# Patient Record
Sex: Female | Born: 1995 | Race: Black or African American | Hispanic: No | Marital: Single | Smoking: Never smoker
Health system: Southern US, Community
[De-identification: ages and names within clinical notes are randomized; demographics above are authoritative.]

## PROBLEM LIST (undated history)

## (undated) DIAGNOSIS — D6800 Von Willebrand disease, unspecified: Secondary | ICD-10-CM

---
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
## 2015-07-12 ENCOUNTER — Emergency Department (HOSPITAL_COMMUNITY)
Admission: EM | Admit: 2015-07-12 | Discharge: 2015-07-12 | Disposition: A | Discharge status: Home / Self Care | Payer: Medicaid Other | Attending: Emergency Medicine | Admitting: Emergency Medicine

## 2015-07-12 ENCOUNTER — Encounter (HOSPITAL_COMMUNITY): Payer: Self-pay | Admitting: Emergency Medicine

## 2015-07-12 ENCOUNTER — Emergency Department (HOSPITAL_COMMUNITY): Payer: Medicaid Other

## 2015-07-12 DIAGNOSIS — Z79899 Other long term (current) drug therapy: Secondary | ICD-10-CM | POA: Diagnosis not present

## 2015-07-12 DIAGNOSIS — Z3202 Encounter for pregnancy test, result negative: Secondary | ICD-10-CM | POA: Insufficient documentation

## 2015-07-12 DIAGNOSIS — Z862 Personal history of diseases of the blood and blood-forming organs and certain disorders involving the immune mechanism: Secondary | ICD-10-CM | POA: Insufficient documentation

## 2015-07-12 DIAGNOSIS — R222 Localized swelling, mass and lump, trunk: Secondary | ICD-10-CM | POA: Insufficient documentation

## 2015-07-12 LAB — POC URINE PREG, ED: Preg Test, Ur: NEGATIVE

## 2015-07-12 NOTE — ED Notes (Signed)
Patient transported to X-ray 

## 2015-07-12 NOTE — ED Provider Notes (Signed)
CSN: [id number]     Arrival date & time 07/12/15  0116 History   First MD Initiated Contact with Patient 07/12/15 0416     No chief complaint on file.    (Consider location/radiation/quality/duration/timing/severity/associated sxs/prior Treatment) HPI   Patient is a 20 year old female, history of von Willebrand's, followed at [facility] hematology, otherwise healthy, presents to the emergency department with chief complaint of nontender non-erythematous bump located on her anterior chest just to the left of her sternum. She states that she met the shower tonight and her boyfriend made a comment about the bump. She states that she has noticed it for several weeks. She denies pain, erythema.  It is firm she believes is part of the chest wall and not part of her left breast. She denies any injury or trauma to her chest. She has no history of bony abnormalities or bony growths.   She denies shortness of breath, chest pain.  Past Medical History  Diagnosis Date  . Von Willebrand disease ([place])    History reviewed. No pertinent past surgical history. History reviewed. No pertinent family history. Social History  Substance Use Topics  . Smoking status: Never Smoker   . Smokeless tobacco: None  . Alcohol Use: No   OB History    No data available     Review of Systems  All other systems reviewed and are negative.     Allergies  Review of patient's allergies indicates no known allergies.  Home Medications   Prior to Admission medications   Medication Sig Start Date End Date Taking? Authorizing Provider  PRESCRIPTION MEDICATION Take 1 tablet by mouth daily. Oral contraceptive    Historical Provider, MD   BP 128/68 mmHg  Pulse 60  Temp(Src) 97.7 F (36.5 C) (Oral)  Resp 16  Wt 55.481 kg  SpO2 98% Physical Exam  Constitutional: She is oriented to person, place, and time. She appears well-developed and well-nourished. No distress.  HENT:  Head: Normocephalic and atraumatic.  Nose:  Nose normal.  Mouth/Throat: Oropharynx is clear and moist. No oropharyngeal exudate.  Eyes: Conjunctivae and EOM are normal. Pupils are equal, round, and reactive to light. Right eye exhibits no discharge. Left eye exhibits no discharge. No scleral icterus.  Neck: Normal range of motion. No JVD present. No tracheal deviation present. No thyromegaly present.  Cardiovascular: Normal rate, regular rhythm, normal heart sounds and intact distal pulses.  Exam reveals no gallop and no friction rub.   No murmur heard. Pulmonary/Chest: Effort normal and breath sounds normal. No respiratory distress. She has no wheezes. She has no rales. She exhibits no tenderness. Left breast exhibits no inverted nipple, no mass, no nipple discharge, no skin change and no tenderness. Breasts are symmetrical.    Breath sounds clear to auscultation anteriorly and posteriorly without wheezes, rales or rhonchi.  Abdominal: Soft. Bowel sounds are normal. She exhibits no distension and no mass. There is no tenderness. There is no rebound and no guarding.  Musculoskeletal: Normal range of motion. She exhibits no edema or tenderness.  Lymphadenopathy:    She has no cervical adenopathy.    She has no axillary adenopathy.       Left axillary: No pectoral and no lateral adenopathy present. Neurological: She is alert and oriented to person, place, and time. She has normal reflexes. No cranial nerve deficit. She exhibits normal muscle tone. Coordination normal.  Skin: Skin is warm and dry. No rash noted. She is not diaphoretic. No erythema. No pallor.  Psychiatric: She  has a normal mood and affect. Her behavior is normal. Judgment and thought content normal.  Nursing note and vitals reviewed.   ED Course  Procedures (including critical care time) Labs Review Labs Reviewed  POC URINE PREG, ED    Imaging Review Dg Chest 2 View  07/12/2015  CLINICAL DATA:  Tender LEFT sternal lump. EXAM: CHEST  2 VIEW COMPARISON:  None.  FINDINGS: Cardiomediastinal silhouette is normal. The lungs are clear without pleural effusions or focal consolidations. Trachea projects midline and there is no pneumothorax. Soft tissue planes and included osseous structures are non-suspicious. Mild upper thoracic dextroscoliosis. IMPRESSION: Normal chest. Electronically Signed   By: Elon Alas M.D.   On: 07/12/2015 05:22   I have personally reviewed and evaluated these images and lab results as part of my medical decision-making.   EKG Interpretation None      MDM   Patient with asymmetry to anterior chest wall, she has noticed it for several weeks however her boyfriend made a comment about it tonight and she presented to the ER for evaluation.  Small asymmetry to the left sternal border is noted on exam, firm to palpation.  No concern for skin infection, cellulitis or abscess.  Area is nontender, no crepitus.  Lungs are clear to auscultation.  Chest x-ray was obtained which did not observe any osseous structure abnormality in the anterior chest, patient does have mild upper thoracic dextroscoliosis, she may have chronic chest wall changes due to this abnormality.  The patient wasn't encouraged to follow up with PCP.  No concern for emergent medical condition which necessitates further workup or treatment.  Patient discharged in good condition with VSS. Filed Vitals:   07/12/15 0121 07/12/15 0553 07/12/15 0624  BP: 136/84 124/88 128/68  Pulse: 67 60 60  Temp: 98.2 F (36.8 C) 97.5 F (36.4 C) 97.7 F (36.5 C)  TempSrc: Oral Oral Oral  Resp: _0 Weight: 55.481 kg    SpO2: 99% 100% 98%     Final diagnoses:  Nodule of chest wall      Delsa Grana, PA-C 07/13/15 [place], MD 07/14/15 [id number]

## 2015-07-12 NOTE — ED Notes (Signed)
Patient arrives with complaint of noticing a bump on her chest. States that she exited the shower tonight and her boyfriend made a comment about it. Denies pain.

## 2015-07-12 NOTE — ED Notes (Signed)
Patient returned to room 6 from x-ray. 

## 2015-08-31 ENCOUNTER — Other Ambulatory Visit: Payer: Self-pay | Admitting: Pediatrics

## 2015-08-31 DIAGNOSIS — R634 Abnormal weight loss: Secondary | ICD-10-CM

## 2015-08-31 DIAGNOSIS — N63 Unspecified lump in unspecified breast: Secondary | ICD-10-CM

## 2015-09-03 ENCOUNTER — Ambulatory Visit
Admission: RE | Admit: 2015-09-03 | Discharge: 2015-09-03 | Disposition: A | Discharge status: Home / Self Care | Payer: Medicaid Other | Source: Ambulatory Visit | Attending: Pediatrics | Admitting: Pediatrics

## 2015-09-03 DIAGNOSIS — N63 Unspecified lump in unspecified breast: Secondary | ICD-10-CM

## 2015-09-03 DIAGNOSIS — R634 Abnormal weight loss: Secondary | ICD-10-CM | POA: Insufficient documentation

## 2015-09-07 ENCOUNTER — Other Ambulatory Visit: Payer: Self-pay | Admitting: Pediatrics

## 2015-09-07 DIAGNOSIS — R922 Inconclusive mammogram: Secondary | ICD-10-CM

## 2015-09-17 ENCOUNTER — Other Ambulatory Visit: Payer: Medicaid Other

## 2015-09-17 ENCOUNTER — Ambulatory Visit
Admission: RE | Admit: 2015-09-17 | Discharge: 2015-09-17 | Disposition: A | Discharge status: Home / Self Care | Payer: Medicaid Other | Source: Ambulatory Visit | Attending: Pediatrics | Admitting: Pediatrics

## 2015-09-17 DIAGNOSIS — R9230 Dense breasts, unspecified: Secondary | ICD-10-CM

## 2015-09-17 DIAGNOSIS — R922 Inconclusive mammogram: Secondary | ICD-10-CM | POA: Diagnosis not present

## 2015-09-17 DIAGNOSIS — N6002 Solitary cyst of left breast: Secondary | ICD-10-CM | POA: Insufficient documentation

## 2016-02-22 ENCOUNTER — Emergency Department
Admission: EM | Admit: 2016-02-22 | Discharge: 2016-02-22 | Disposition: A | Discharge status: Home / Self Care | Payer: Medicaid Other | Attending: Emergency Medicine | Admitting: Emergency Medicine

## 2016-02-22 ENCOUNTER — Encounter: Payer: Self-pay | Admitting: Emergency Medicine

## 2016-02-22 DIAGNOSIS — K219 Gastro-esophageal reflux disease without esophagitis: Secondary | ICD-10-CM | POA: Insufficient documentation

## 2016-02-22 DIAGNOSIS — R1013 Epigastric pain: Secondary | ICD-10-CM

## 2016-02-22 DIAGNOSIS — R079 Chest pain, unspecified: Secondary | ICD-10-CM | POA: Diagnosis present

## 2016-02-22 MED ORDER — GI COCKTAIL ~~LOC~~
30.0000 mL | ORAL | Status: AC
  Administered 2016-02-22: 30 mL via ORAL
  Filled 2016-02-22: qty 30

## 2016-02-22 MED ORDER — FAMOTIDINE 20 MG PO TABS
40.0000 mg | ORAL_TABLET | Freq: Once | ORAL | Status: AC
  Administered 2016-02-22: 40 mg via ORAL
  Filled 2016-02-22: qty 2

## 2016-02-22 MED ORDER — FAMOTIDINE 20 MG PO TABS: 20.0000 mg | ORAL_TABLET | Freq: Two times a day (BID) | ORAL | 0 refills | Status: AC

## 2016-02-22 NOTE — ED Triage Notes (Signed)
Pt c/o intermittent left chest pain for last month but today pain also in back. Reports felt like it took her breath away for 5 seconds. No distress at this time.

## 2016-02-22 NOTE — ED Provider Notes (Signed)
[facility] Emergency Department Provider Note  ____________________________________________  Time seen: Approximately 8:43 AM  I have reviewed the triage vital signs and the nursing notes.   HISTORY  Chief Complaint Chest Pain    HPI Archie Pattenbonie Mannis is a 20 y.o. female who complains of mid back and lower anterior chest painintermittently for the past month, worse last night. She works overnight. Comes in episodes lasting for about 5 seconds at a time, so certainly was some nausea but no vomiting. Currently symptoms are resolved. No aggravating or alleviating factors. Not exertional, not pleuritic. No recent illness, no sick contacts.     Past Medical History:  Diagnosis Date  . Von Willebrand disease ([facility])   [facility].   There are no active problems to display for this patient.    History reviewed. No pertinent surgical history.   Prior to Admission medications   Medication Sig Start Date End Date Taking? Authorizing Provider  famotidine (PEPCID) 20 MG tablet Take 1 tablet (20 mg total) by mouth 2 (two) times daily. 02/22/16   Sharman CheekPhillip Maahi Lannan, MD  PRESCRIPTION MEDICATION Take 1 tablet by mouth daily. Oral contraceptive    Historical Provider, MD     Allergies Patient has no known allergies.   History reviewed. No pertinent family history.  Social History Social History  Substance Use Topics  . Smoking status: Never Smoker  . Smokeless tobacco: Not on file  . Alcohol use No    Review of Systems  Constitutional:   No fever or chills.  ENT:   No sore throat. No rhinorrhea. Cardiovascular:   Positive as above chest pain. Respiratory:   No dyspnea or cough. Gastrointestinal:   Negative for abdominal pain, vomiting and diarrhea.  Genitourinary:   Negative for dysuria or difficulty urinating. Musculoskeletal:   Negative for focal pain or swelling Neurological:   Negative for headaches 10-point ROS  otherwise negative.  ____________________________________________   PHYSICAL EXAM:  VITAL SIGNS: ED Triage Vitals  Enc Vitals Group     BP 02/22/16 0822 135/63     Pulse Rate 02/22/16 0821 71     Resp 02/22/16 0821 16     Temp 02/22/16 0821 98.6 F (37 C)     Temp Source 02/22/16 0821 Oral     SpO2 02/22/16 0821 100 %     Weight 02/22/16 0821 127 lb (57.6 kg)     Height 02/22/16 0821 5\' 5"  (1.651 m)     Head Circumference --      Peak Flow --      Pain Score 02/22/16 0822 7     Pain Loc --      Pain Edu? --      Excl. in GC? --     Vital signs reviewed, nursing assessments reviewed.   Constitutional:   Alert and oriented. Well appearing and in no distress. Eyes:   No scleral icterus. No conjunctival pallor. PERRL. EOMI.  No nystagmus. ENT   Head:   Normocephalic and atraumatic.   Nose:   No congestion/rhinnorhea. No septal hematoma   Mouth/Throat:   MMM, no pharyngeal erythema. No peritonsillar mass.    Neck:   No stridor. No SubQ emphysema. No meningismus. Hematological/Lymphatic/Immunilogical:   No cervical lymphadenopathy. Cardiovascular:   RRR. Symmetric bilateral radial and DP pulses.  No murmurs.  Respiratory:   Normal respiratory effort without tachypnea nor retractions. Breath sounds are clear and equal bilaterally. No wheezes/rales/rhonchi.Chest wall nontender. Examined with nurse Augusto GambleJerri  at bedside Gastrointestinal:   Soft with mild epigastric tenderness. Non distended. There is no CVA tenderness.  No rebound, rigidity, or guarding. Genitourinary:   deferred Musculoskeletal:   Nontender with normal range of motion in all extremities. No joint effusions.  No lower extremity tenderness.  No edema. Neurologic:   Normal speech and language.  CN 2-10 normal. Motor grossly intact. No gross focal neurologic deficits are appreciated.  Skin:    Skin is warm, dry and intact. No rash noted.  No petechiae, purpura, or  bullae.  ____________________________________________    LABS (pertinent positives/negatives) (all labs ordered are listed, but only abnormal results are displayed) Labs Reviewed - No data to display ____________________________________________   EKG  Interpreted by me Normal sinus rhythm rate of 69, normal axis and intervals. Normal QRS ST segments and T waves.  ____________________________________________    RADIOLOGY    ____________________________________________   PROCEDURES Procedures  ____________________________________________   INITIAL IMPRESSION / ASSESSMENT AND PLAN / ED COURSE  Pertinent labs & imaging results that were available during my care of the patient were reviewed by me and considered in my medical decision making (see chart for details).  Visual: No acute distress. Epigastric tenderness compatible with gastritis. Symptoms are atypical for significant cardiopulmonary pathology, abdominal exam is otherwise benign and nonsurgical.Considering the patient's symptoms, medical history, and physical examination today, I have low suspicion for ACS, PE, TAD, pneumothorax, carditis, mediastinitis, pneumonia, CHF, or sepsis.  Low suspicion for perforation obstruction pancreatitis biliary disease.     Clinical Course    ____________________________________________   FINAL CLINICAL IMPRESSION(S) / ED DIAGNOSES  Final diagnoses:  Epigastric pain  Gastroesophageal reflux disease, esophagitis presence not specified      New Prescriptions   FAMOTIDINE (PEPCID) 20 MG TABLET    Take 1 tablet (20 mg total) by mouth 2 (two) times daily.     Portions of this note were generated with dragon dictation software. Dictation errors may occur despite best attempts at proofreading.    Sharman CheekPhillip Danette Weinfeld, MD 02/22/16 [phone]

## 2016-02-23 ENCOUNTER — Emergency Department
Admission: EM | Admit: 2016-02-23 | Discharge: 2016-02-24 | Disposition: A | Discharge status: Home / Self Care | Payer: Medicaid Other | Attending: Emergency Medicine | Admitting: Emergency Medicine

## 2016-02-23 ENCOUNTER — Encounter: Payer: Self-pay | Admitting: Emergency Medicine

## 2016-02-23 DIAGNOSIS — R112 Nausea with vomiting, unspecified: Secondary | ICD-10-CM | POA: Insufficient documentation

## 2016-02-23 DIAGNOSIS — R197 Diarrhea, unspecified: Secondary | ICD-10-CM | POA: Diagnosis not present

## 2016-02-23 LAB — COMPREHENSIVE METABOLIC PANEL
ALBUMIN: 5.5 g/dL — AB (ref 3.5–5.0)
ALT: 14 U/L (ref 14–54)
AST: 20 U/L (ref 15–41)
Alkaline Phosphatase: 41 U/L (ref 38–126)
Anion gap: 5 (ref 5–15)
BUN: 15 mg/dL (ref 6–20)
CO2: 26 mmol/L (ref 22–32)
CREATININE: 0.69 mg/dL (ref 0.44–1.00)
Calcium: 9.8 mg/dL (ref 8.9–10.3)
Chloride: 106 mmol/L (ref 101–111)
GFR calc Af Amer: >60 mL/min (ref >60)
GFR calc non Af Amer: >60 mL/min (ref >60)
Glucose, Bld: 95 mg/dL (ref 65–99)
Potassium: 3.8 mmol/L (ref 3.5–5.1)
Sodium: 137 mmol/L (ref 135–145)
Total Bilirubin: 1 mg/dL (ref 0.3–1.2)
Total Protein: 8.2 g/dL — ABNORMAL HIGH (ref 6.5–8.1)

## 2016-02-23 LAB — CBC
HCT: 42 % (ref 35.0–47.0)
Hemoglobin: 14.5 g/dL (ref 12.0–16.0)
MCH: 31.3 pg (ref 26.0–34.0)
MCHC: 34.4 g/dL (ref 32.0–36.0)
MCV: 90.8 fL (ref 80.0–100.0)
PLATELETS: 242 10*3/uL (ref 150–440)
RBC: 4.63 MIL/uL (ref 3.80–5.20)
RDW: 13.2 % (ref 11.5–14.5)
WBC: 9 10*3/uL (ref 3.6–11.0)

## 2016-02-23 LAB — URINALYSIS, COMPLETE (UACMP) WITH MICROSCOPIC
BILIRUBIN URINE: NEGATIVE
Bacteria, UA: NONE SEEN
Glucose, UA: NEGATIVE mg/dL
Hgb urine dipstick: NEGATIVE
Ketones, ur: 20 mg/dL — AB
Leukocytes, UA: NEGATIVE
Nitrite: NEGATIVE
Protein, ur: NEGATIVE mg/dL
SPECIFIC GRAVITY, URINE: 1.028 (ref 1.005–1.030)
pH: 5 (ref 5.0–8.0)

## 2016-02-23 LAB — LIPASE, BLOOD: LIPASE: 15 U/L (ref 11–51)

## 2016-02-23 MED ORDER — ONDANSETRON HCL 4 MG/2ML IJ SOLN
4.0000 mg | Freq: Once | INTRAMUSCULAR | Status: AC
  Administered 2016-02-23: 4 mg via INTRAVENOUS
  Filled 2016-02-23: qty 2

## 2016-02-23 MED ORDER — SODIUM CHLORIDE 0.9 % IV BOLUS (SEPSIS)
1000.0000 mL | Freq: Once | INTRAVENOUS | Status: AC
Start: 2016-02-23 — End: 2016-02-24
  Administered 2016-02-23: 1000 mL via INTRAVENOUS

## 2016-02-23 MED ORDER — LOPERAMIDE HCL 2 MG PO CAPS
4.0000 mg | ORAL_CAPSULE | Freq: Once | ORAL | Status: AC
  Administered 2016-02-23: 4 mg via ORAL
  Filled 2016-02-23: qty 2

## 2016-02-23 NOTE — ED Provider Notes (Signed)
[facility] Emergency Department Provider Note   First MD Initiated Contact with Patient 02/23/16 2320     (approximate)  I have reviewed the triage vital signs and the nursing notes.   HISTORY  Chief Complaint Emesis and Diarrhea   HPI Catherine Kent is a 20 y.o. female presents with acute onset of nausea vomiting and diarrhea this morning. Patient admits to generalized abdominal cramping worse in the left upper/lower quadrant. Patient denies any fever or febrile on presentation with temperature 98.3. Patient was seen in the emergency department today and prescribed famotidine which she states has not improved her symptoms at all. Patient states that nausea has been persistent Patient states she has noted no blood or mucus in her diarrhea. Current pain score 4 out of 10   Past Medical History:  Diagnosis Date  . Von Willebrand disease (HCC)     There are no active problems to display for this patient.   History reviewed. No pertinent surgical history.  Prior to Admission medications   Medication Sig Start Date End Date Taking? Authorizing Provider  famotidine (PEPCID) 20 MG tablet Take 1 tablet (20 mg total) by mouth 2 (two) times daily. 02/22/16   Sharman CheekPhillip Stafford, MD  PRESCRIPTION MEDICATION Take 1 tablet by mouth daily. Oral contraceptive    Historical Provider, MD    Allergies Patient has no known allergies.  No family history on file.  Social History Social History  Substance Use Topics  . Smoking status: Never Smoker  . Smokeless tobacco: Never Used  . Alcohol use No    Review of Systems Constitutional: No fever/chills Eyes: No visual changes. ENT: No sore throat. Cardiovascular: Denies chest pain. Respiratory: Denies shortness of breath. Gastrointestinal: Positive for abdominal cramping nausea and Genitourinary: Negative for dysuria. Musculoskeletal: Negative for back pain. Skin: Negative for rash. Neurological: Negative for  headaches, focal weakness or numbness.  10-point ROS otherwise negative.  ____________________________________________   PHYSICAL EXAM:  VITAL SIGNS: ED Triage Vitals  Enc Vitals Group     BP 02/23/16 2055 130/84     Pulse Rate 02/23/16 2055 67     Resp 02/23/16 2055 18     Temp 02/23/16 2055 98.3 F (36.8 C)     Temp Source 02/23/16 2055 Oral     SpO2 02/23/16 2055 100 %     Weight 02/23/16 2059 127 lb (57.6 kg)     Height 02/23/16 2059 5\' 5"  (1.651 m)     Head Circumference --      Peak Flow --      Pain Score 02/23/16 2059 5     Pain Loc --      Pain Edu? --      Excl. in GC? --     Constitutional: Alert and oriented. Well appearing and in no acute distress. Eyes: Conjunctivae are normal. PERRL. EOMI. Head: Atraumatic. Mouth/Throat: Mucous membranes are moist.  Oropharynx non-erythematous. Neck: No stridor.  No meningeal signs.   Cardiovascular: Normal rate, regular rhythm. Good peripheral circulation. Grossly normal heart sounds. Respiratory: Normal respiratory effort.  No retractions. Lungs CTAB. Gastrointestinal: Soft and nontender. No distention.  Musculoskeletal: No lower extremity tenderness nor edema. No gross deformities of extremities. Neurologic:  Normal speech and language. No gross focal neurologic deficits are appreciated.  Skin:  Skin is warm, dry and intact. No rash noted. Psychiatric: Mood and affect are normal. Speech and behavior are normal.  ____________________________________________   LABS (all labs ordered are listed, but only abnormal  results are displayed)  Labs Reviewed  COMPREHENSIVE METABOLIC PANEL - Abnormal; Notable for the following:       Result Value   Total Protein 8.2 (*)    Albumin 5.5 (*)    All other components within normal limits  URINALYSIS, COMPLETE (UACMP) WITH MICROSCOPIC - Abnormal; Notable for the following:    Color, Urine YELLOW (*)    APPearance CLEAR (*)    Ketones, ur 20 (*)    Squamous Epithelial / LPF 0-5  (*)    All other components within normal limits  LIPASE, BLOOD  CBC  POC URINE PREG, ED    Procedures     INITIAL IMPRESSION / ASSESSMENT AND PLAN / ED COURSE  Pertinent labs & imaging results that were available during my care of the patient were reviewed by me and considered in my medical decision making (see chart for details).  She given IV Zofran 4 mg Imodium 4 mg 1 L IV normal saline with improvement of symptoms. History of physical exam consistent with possible gastroenteritis given absence of abdominal pain with palpation. Laboratory data reassuring as such no imaging of the abdomen was performed   Clinical Course     ____________________________________________  FINAL CLINICAL IMPRESSION(S) / ED DIAGNOSES  Final diagnoses:  Nausea vomiting and diarrhea     MEDICATIONS GIVEN DURING THIS VISIT:  Medications  sodium chloride 0.9 % bolus 1,000 mL (not administered)  ondansetron (ZOFRAN) injection 4 mg (not administered)  loperamide (IMODIUM) capsule 4 mg (not administered)     NEW OUTPATIENT MEDICATIONS STARTED DURING THIS VISIT:  New Prescriptions   No medications on file    Modified Medications   No medications on file    Discontinued Medications   No medications on file     Note:  This document was prepared using Dragon voice recognition software and may include unintentional dictation errors.    Darci Currentandolph N Camilia [place], MD 02/24/16 [phone]

## 2016-02-23 NOTE — ED Notes (Signed)
POC Urine preg result= NEGATIVE per Mayra, EDT

## 2016-02-23 NOTE — ED Notes (Signed)
Brown, MD and Butch, RN at bedside at this time. 

## 2016-02-23 NOTE — ED Triage Notes (Signed)
Pt ambulatory to triage with c/o N/V/D since this morning. Pt reports was seen here yesterday for chest pain and back pain, states was feeling nauseous this morning and took a famotidine and reports has been having vomiting and diarrhea since.

## 2016-02-24 LAB — POCT PREGNANCY, URINE: PREG TEST UR: NEGATIVE

## 2016-02-24 LAB — PREGNANCY, URINE: Preg Test, Ur: NEGATIVE

## 2016-02-24 MED ORDER — ONDANSETRON 4 MG PO TBDP: 4.0000 mg | ORAL_TABLET | Freq: Three times a day (TID) | ORAL | 0 refills | Status: AC | PRN

## 2016-02-24 NOTE — ED Notes (Signed)
Pt resting comfortably;  pt reports no c/o nausea or further occurrences of diarrhea at this time.

## 2016-08-03 IMAGING — US US BREAST*L* LIMITED INC AXILLA
1 series · 13 of 13 positions shown · non-contrast
Comparison: None.

CLINICAL DATA: 20-year-old female who first noticed a lump in the
medial aspect of the left breast in March 2015. The patient's
clinician also notes a lump at [DATE] on the left breast.

EXAM:
ULTRASOUND OF THE LEFT BREAST

[Series 1: us breast*left* limited inc axilla · 0.06mm/px · 13 of 13 slices shown]
[im 1/13]
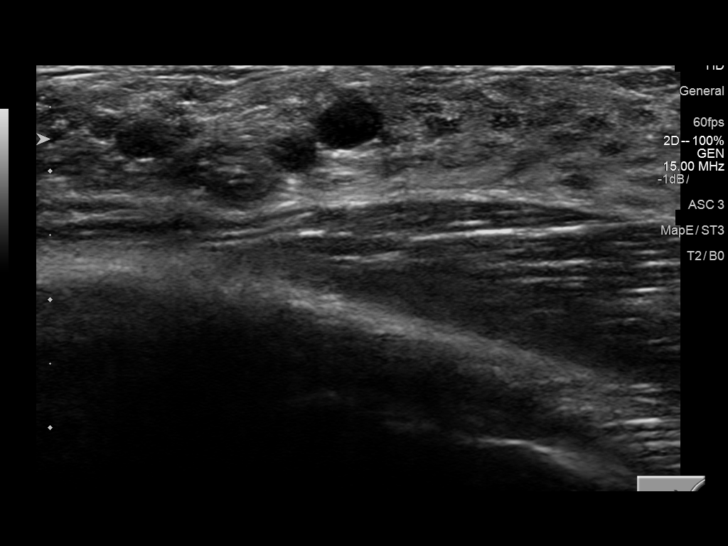
[im 2/13]
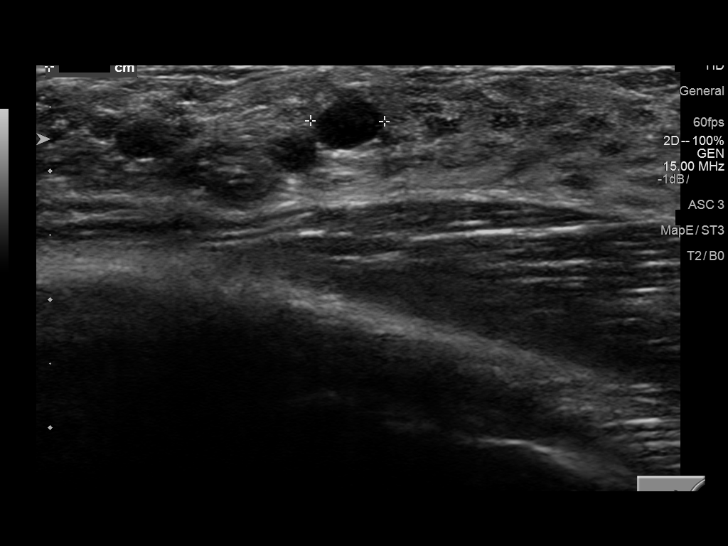
[im 3/13]
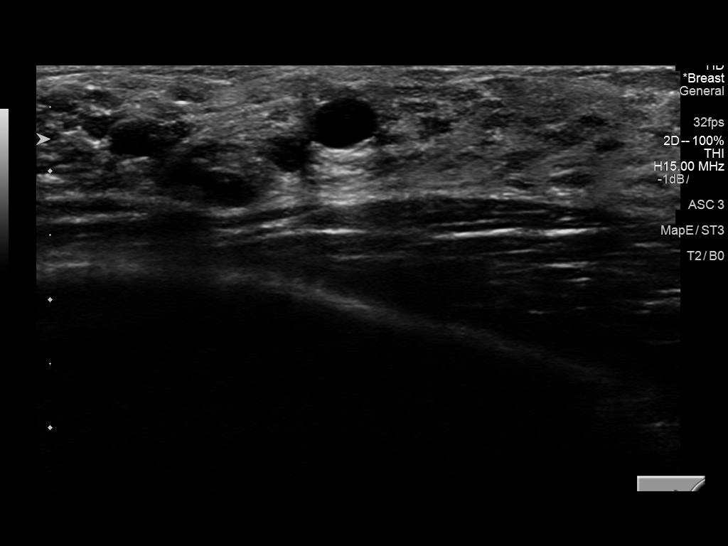
[im 4/13]
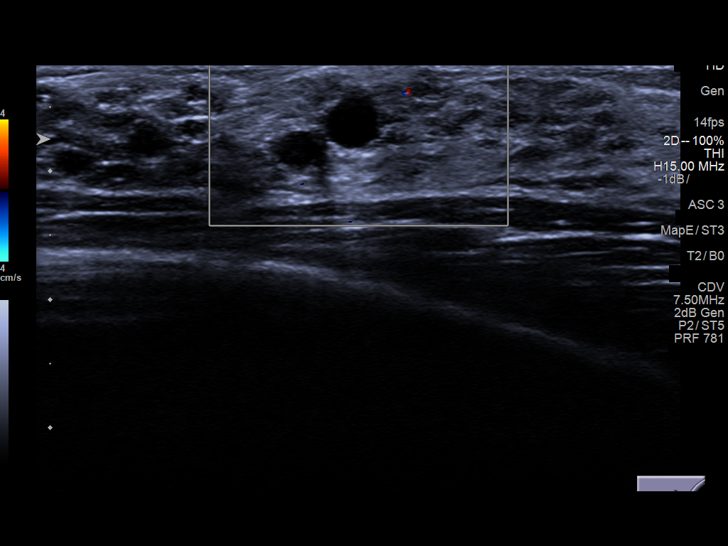
[im 5/13]
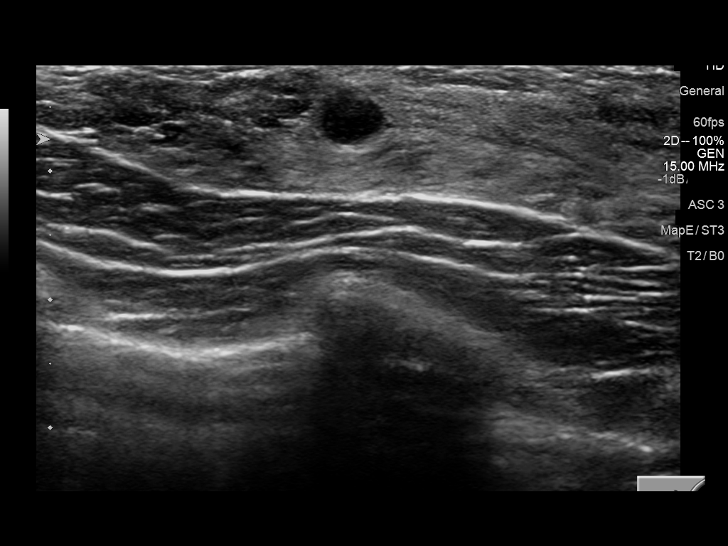
[im 6/13]
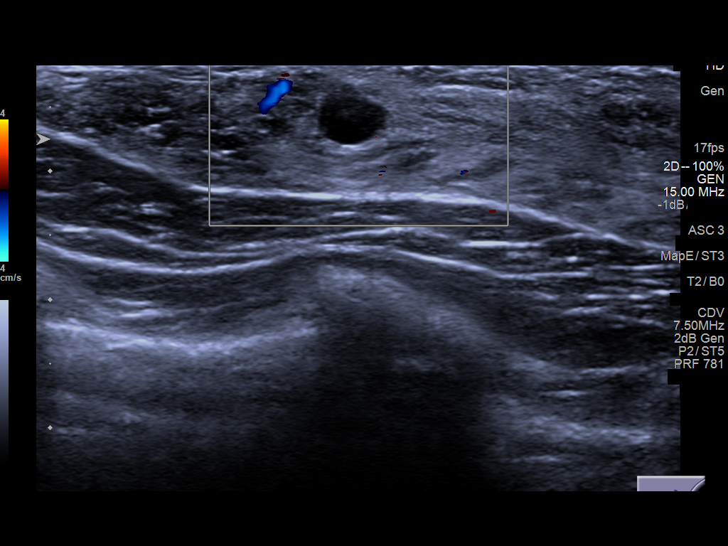
[im 7/13]
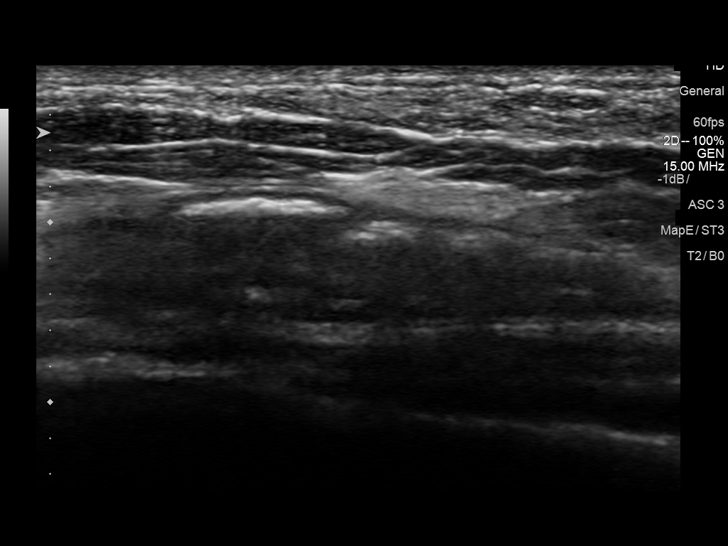
[im 8/13]
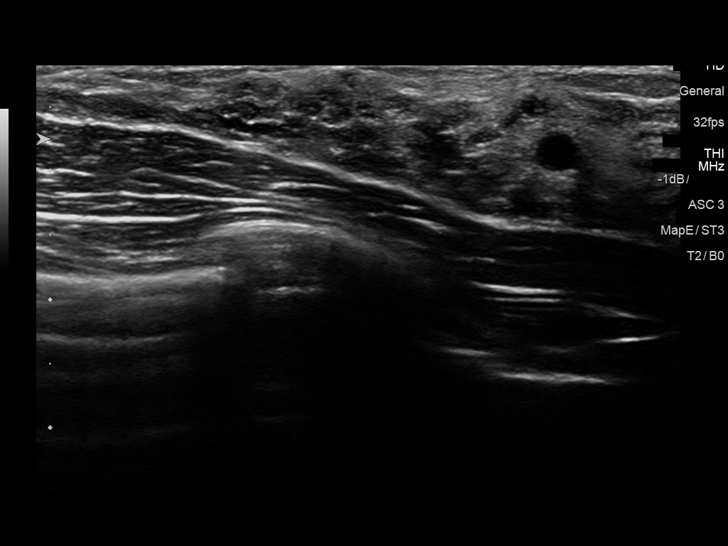
[im 9/13]
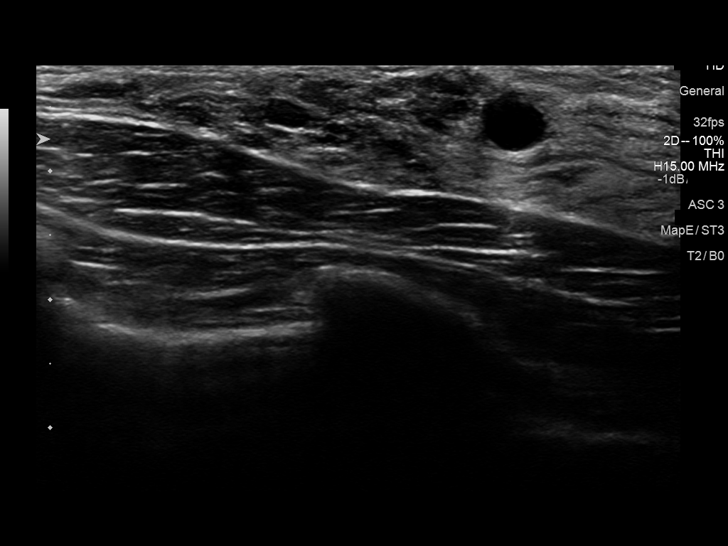
[im 10/13]
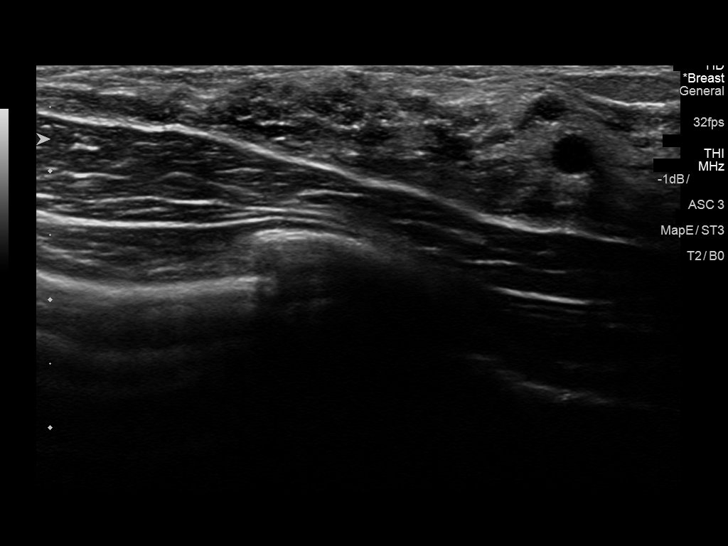
[im 11/13]
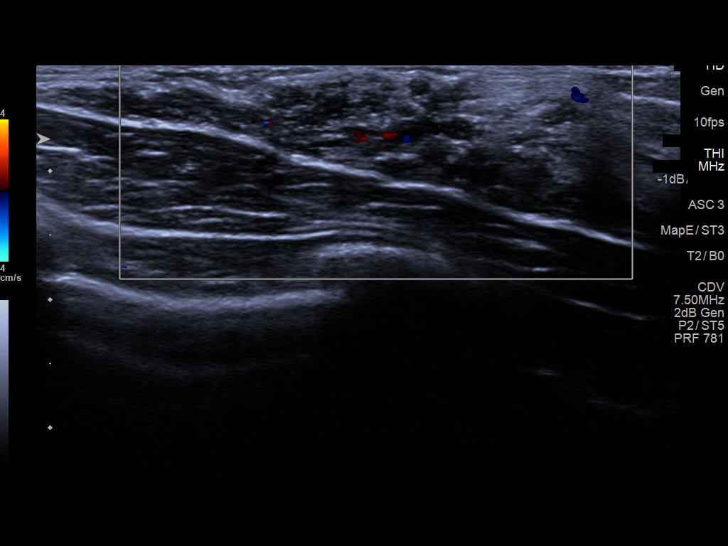
[im 12/13]
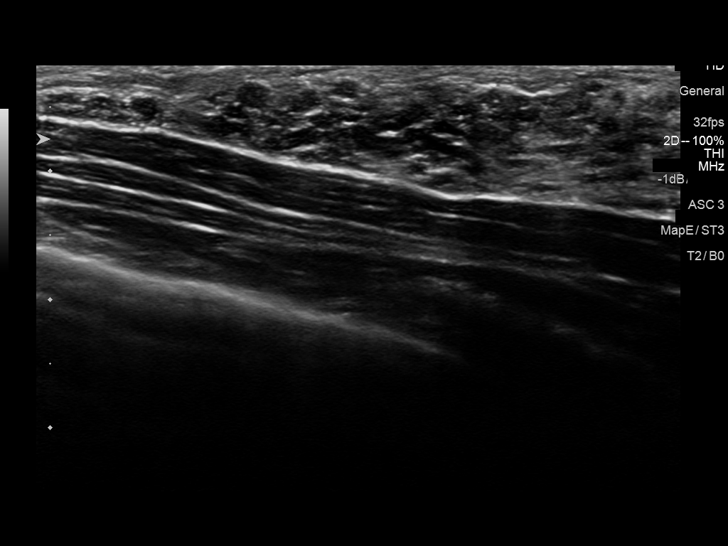
[im 13/13]
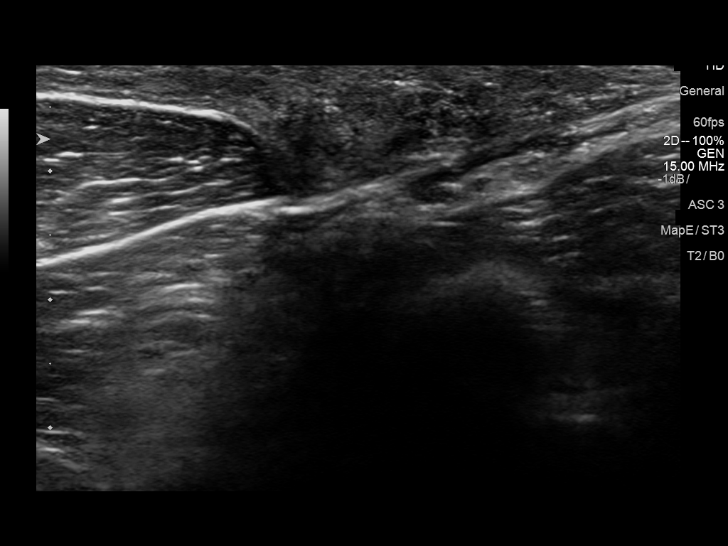

[13 of 13 positions shown; findings below may reference images not displayed]

FINDINGS: On physical exam, a bony prominence is noted in the medial aspect of
the left breast in the region of patient's concern. In addition, an
approximately 3 cm mobile mass is felt within the left breast at 11
o'clock, 4 cm from the nipple.

Targeted ultrasound was performed demonstrating no suspicious cystic
or solid sonographic finding in the area of bony prominence felt on
exam. The bony prominence corresponds to the underlying rib.

At 11 o'clock, 4 cm from the nipple, there is an approximately 4 x 4
x 0.9 cm area of dense breast tissue containing numerous small cysts
and corresponding to the palpable abnormality.
IMPRESSION: Incomplete evaluation of the left breast finding at 11 o'clock, 4 cm
from the nipple.

RECOMMENDATION:
Bilateral diagnostic mammogram is recommended for further
evaluation. An order was unable to be obtained for this examination
today.

If after further evaluation, biopsy is felt indicated, consultation
with the patient's hematologist will be necessary due to the
patient's history of von Willebrand disease.

I have discussed the findings and recommendations with the patient.
Results were also provided in writing at the conclusion of the
visit. If applicable, a reminder letter will be sent to the patient
regarding the next appointment.

BI-RADS CATEGORY  0: Incomplete. Need additional imaging evaluation
and/or prior mammograms for comparison.
